# Patient Record
Sex: Female | Born: 1980
Health system: Northeastern US, Academic
[De-identification: ages and names within clinical notes are randomized; demographics above are authoritative.]

---

## 2019-09-13 ENCOUNTER — Ambulatory Visit

## 2019-09-25 ENCOUNTER — Ambulatory Visit: Admitting: Surgery

## 2019-09-25 ENCOUNTER — Ambulatory Visit: Admit: 2019-09-25 | Payer: No Typology Code available for payment source

## 2019-09-25 NOTE — Progress Notes (Signed)
 .  Progress Notes  .  Patient: Cassandra Wilson  Provider: Foster Simpson  DOB: 1980-10-18 Age: 39 Y Sex: Female  .  [facility]    Date: 09/25/2019  .  --------------------------------------------------------------------------------  .  HISTORY OF PRESENT ILLNESS  .  GENERAL:   Cassandra Wilson is a pleasant 39 year old woman who is here today  in consultation for a left lower quadrant intramuscular mass. She  noticed the mass 6 months after undergoing a cesarean section  with her fourth child. This was approximately 18 months ago. She  reports that the mass has increased in size and causes her  discomfort that is worse around the time of her menstrual cycle.  The pain is severe enough that it interferes with her daily  activities. She had a CT scan that noted a 2.0 x 2.8 x 2.4 cm of  the left rectus abdominis musculature suggesting an endometrioma  or desmoid tumor. She is here to discuss her treatment options.  .  CURRENT MEDICATIONS  .  Taking ibuprofen  Taking Tylenol  .  ALLERGIES  .  yes[Allergies Verified]  .  SURGICAL HISTORY  .  Cesarean section  .  SOCIAL HISTORY  .  .  Tobaccohistory:Currently smoking 10 cigaretts per day.  .  Alcohol Denies.  Marland Kitchen  REVIEW OF SYSTEMS  .  Surgery:  .  Constitutional:    no fever, chills, or general weakness, no  recent weight change . H&N:    no ear pain, no hoarseness, no  headache . Skin:    no skin lesions, no rash . Lymphatics:    no  swollen glands, no painful glands . Heart:    no chest pain, no  fluttering of heart . Lung:    no shortness of breath, no new or  frequent cough . Gastro:    no nausea/vomiting, no abdominal pain  or cramps, no constipation or diarrhea, no blood in stool, no  heartburn, no regurgitation . Urine:    no blood in urine, no  pain or burning while urinating . Neuro:    no dizzy spells,  fainting, no visual loss, no speech disturbance, no motor or  sensory events, no seizures . Musc:    no bone or joint pain  .  Marland Kitchen  VITAL SIGNS  .  Pain scale 0, Ht-in 64, Wt-lbs 109.6, BMI 18.81, BP 126/87, HR  121, RR 16, Temp 98.4, BSA 1.50, O2 100, Ht-cm 162.56, Wt-kg  49.71.  Marland Kitchen  PHYSICAL EXAMINATION  .  Surgery:  General Appearance  alert, oriented, and in no distress.  Gastrointestinal  abdomen soft and nontender, palpable 2.5 cm  mass at lateral aspect of cesarean section scar.  .  ASSESSMENTS  .  Left lower quadrant abdominal mass - R19.04 (Primary)  .  TREATMENT  .  Left lower quadrant abdominal mass  Notes:  Cassandra Wilson has a left lower quadrant intramuscular mass that  is been present for approximately 18 months and causes her  significant discomfort. Today her treatment options were  discussed in great detail and at this time an MRI will be ordered  for further evaluation. She will return to clinic following the  MRI to discuss the findings. She will contact our office with any  questions or concerns that arise anytime.  Thank you for allowing [place] to participate in the care of your  patient. If you have any questions or comments, please do not  hesitate to contact our office..  .  FOLLOW UP  .  prn  .  Electronically signed by Andy Gauss , MD on  11/18/2019 at 03:52 PM EDT  .  Document electronically signed by Foster Simpson   .

## 2019-09-25 NOTE — Progress Notes (Signed)
 * * *    Wilson, Cassandra **DOB:** November 02, 1980 (39 yo F) **Acc No.** [id number] **DOS:**  09/25/2019    ---       Cassandra Wilson**    ------    68 Y old Female, DOB: [birth year], External MRN: [id number]    Account Number: [id number]    [address], [id number]    Home: [phone]    Insurance: [employer]    [facility]P: [facility] Referring: Unknown Unknown    Appointment Facility: Surgical Oncology        * * *    09/25/2019 Progress Notes: Andy Gauss, MD **CHN#:** [id number]    ------    ---       **History of Present Illness**    ---     _GENERAL_ :    Cassandra Wilson is a pleasant 39 year old woman who is here today in  consultation for a left lower quadrant intramuscular mass. She noticed the  mass 6 months after undergoing a cesarean section with her fourth child. This  was approximately 18 months ago. She reports that the mass has increased in  size and causes her discomfort that is worse around the time of her menstrual  cycle. The pain is severe enough that it interferes with her daily activities.  She had a CT scan that noted a 2.0 x 2.8 x 2.4 cm of the left rectus abdominis  musculature suggesting an endometrioma or desmoid tumor. She is here to  discuss her treatment options.      **Current Medications**    ---    Taking    * ibuprofen     ---    * Tylenol     ---     **Surgical History**    ---      Cesarean section    ---     **Social History**    ---    Tobacco  history: Currently smoking 10 cigaretts per day.    Alcohol  Denies.     **Review of Systems**    ---     _Surgery_ :    Constitutional: no fever, chills, or general weakness, no recent weight  change. H&N: no ear pain, no hoarseness, no headache. Skin: no skin lesions,  no rash. Lymphatics: no swollen glands, no painful glands. Heart: no chest  pain, no fluttering of heart. Lung: no shortness of breath, no new or frequent  cough. Gastro: no nausea/vomiting, no abdominal pain or cramps,  no  constipation or diarrhea, no blood in stool, no heartburn, no regurgitation.  Urine: no blood in urine, no pain or burning while urinating. Neuro: no dizzy  spells, fainting, no visual loss, no speech disturbance, no motor or sensory  events, no seizures. Musc: no bone or joint pain.         **Vital Signs**    ---    Pain scale 0, Ht-in 64, Wt-lbs 109.6, BMI 18.81, BP 126/87, HR 121, RR 16,  Temp 98.4, BSA 1.50, O2 100, Ht-cm 162.56, Wt-kg 49.71.      **Physical Examination**    ---     _Surgery_ :    General Appearance alert, oriented, and in no distress. Gastrointestinal  abdomen soft and nontender, palpable 2.5 cm mass at lateral aspect of cesarean  section scar.         **Assessments**    ---    1\. Left lower quadrant abdominal mass - R19.04 (Primary)    ---      **  Treatment**    ---      **1\. Left lower quadrant abdominal mass**    Notes:    [name] has a left lower quadrant intramuscular mass that is been  present for approximately 18 months and causes her significant discomfort.  Today her treatment options were discussed in great detail and at this time an  MRI will be ordered for further evaluation. She will return to clinic  following the MRI to discuss the findings. She will contact our office with  any questions or concerns that arise anytime.    Thank you for allowing [place] to participate in the care of your patient. If you  have any questions or comments, please do not hesitate to contact our office..    ---     **Follow Up**    ---    prn    Electronically signed by Andy Gauss , MD on 11/18/2019 at 03:52 PM EDT    Sign off status: Completed        * * *        Surgical Oncology    [address]    [place], [address]    [place], [place] [address]    Tel: [phone]    Fax: [phone]              * * *          Progress Note: Andy Gauss, MD 09/25/2019    ---    Note generated by eClinicalWorks EMR/PM Software ([email or web address])

## 2019-10-09 ENCOUNTER — Ambulatory Visit: Admitting: Surgery

## 2019-10-09 ENCOUNTER — Ambulatory Visit: Admit: 2019-10-09 | Payer: Medicaid Other

## 2019-10-09 NOTE — Progress Notes (Signed)
 .  Progress Notes  .  Patient: Cassandra Wilson  Provider: Foster Simpson  DOB: 12-Feb-1981 Age: 39 Y Sex: Female  .  [facility]    Date: 10/09/2019  .  --------------------------------------------------------------------------------  .  REASON FOR APPOINTMENT  .  1. MRI REVIEW  .  HISTORY OF PRESENT ILLNESS  .  Ambulatory Falls and Injury Prevention:  HPI  .  Have you experienced a fall in the past year?No , Is the patient  using assistive devices such as a cane or walker?No , Do you need  assistance with ambulation while at our facility?No  .  .  GENERAL:   Cassandra Wilson is a pleasant 39 year old woman who is here today  in consultation for a left lower quadrant intramuscular mass. She  noticed the mass 6 months after undergoing a cesarean section  with her fourth child. This was approximately 18 months ago. She  reports that the mass has increased in size and causes her  discomfort that is worse around the time of her menstrual cycle.  The pain is severe enough that it interferes with her daily  activities. She had a CT scan that noted a 2.0 x 2.8 x 2.4 cm of  the left rectus abdominis musculature suggesting an endometrioma  or desmoid tumor. She was unable to get an MRI as was discussed  at her previous appointment. She is here to review her treatment  options.  .  CURRENT MEDICATIONS  .  Taking ibuprofen  Taking Tylenol  .  PAST MEDICAL HISTORY  .  Abdominal wall mass  Pregnancy: yes  .  ALLERGIES  .  yes[Allergies Verified]  .  SURGICAL HISTORY  .  Cesarean section  .  SOCIAL HISTORY  .  .  Tobaccohistory:Currently smoking 10 cigaretts per day.  .  Alcohol Occasionally.  Marland Kitchen  REVIEW OF SYSTEMS  .  Surgery:  .  Constitutional:    no fever, chills, or general weakness, no  recent weight change . H&N:    no ear pain, no hoarseness, no  headache . Skin:    no skin lesions, no rash . Lymphatics:    no  swollen glands, no painful glands . Heart:    no chest pain, no  fluttering of heart  . Lung:    no shortness of breath, no new or  frequent cough . Gastro:    no nausea/vomiting, no abdominal pain  or cramps, no constipation or diarrhea, no blood in stool, no  heartburn, no regurgitation . Urine:    no blood in urine, no  pain or burning while urinating . Neuro:    no dizzy spells,  fainting, no visual loss, no speech disturbance, no motor or  sensory events, no seizures . Musc:    no bone or joint pain .  Marland Kitchen  VITAL SIGNS  .  Pain scale 6, Ht-in 64, Wt-lbs 108.6, BMI 18.64, BP 119/75, HR  93, RR 16, BSA 1.49, O2 99, Ht-cm 162.56, Wt-kg 49.26, Wt Change  -1 lb, Temp 97.3no flu like symptoms.  .  PHYSICAL EXAMINATION  .  Surgery:  General Appearance  alert, oriented, and in no distress.  Gastrointestinal  abdomen soft and nontender, palpable 2.5 cm  mass at lateral aspect of cesarean section scar.  .  ASSESSMENTS  .  Abdominal wall mass - R22.2 (Primary)  .  TREATMENT  .  Abdominal wall mass  Notes:  Cassandra Wilson has an  abdominal wall mass that is suspected to be  an endometrioma. An MRI was recommended however she was unable to  complete the study. Today her treatment options were discussed in  great detail at this time she wishes to proceed with a radical  resection of the abdominal wall mass. The risks of surgery were  reviewed and she understands. Surgery will be scheduled to take  place in the near future and she will contact our office with any  questions or concerns that arise in the meantime.  Thank you for allowing [place] to participate in the care of your  patient. If you have any questions or comments, please do not  hesitate to contact our office.  .  FOLLOW UP  .  prn  .  Electronically signed by Andy Gauss , MD on  11/19/2019 at 05:07 PM EDT  .  Document electronically signed by Foster Simpson   .

## 2019-10-09 NOTE — Progress Notes (Signed)
 * * *    Wilson, Cassandra **DOB:** Sep 17, 1980 (39 yo F) **Acc No.** [id number] **DOS:**  10/09/2019    ---       Cassandra Wilson**    ------    59 Y old Female, DOB: [birth year], External MRN: [id number]    Account Number: [id number]    [address] [place] 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 MRI REVIEW    ---      **History of Present Illness**    ---     _Ambulatory Falls and Injury Prevention_ :    HPI Have you experienced a fall in the past year? No, Is the patient using  assistive devices such as a cane or walker? No, Do you need assistance with  ambulation while at our facility? No.    _GENERAL_ :    [name] is a pleasant 39 year old woman who is here today in  consultation for a left lower quadrant intramuscular mass. She noticed the  mass 6 months after undergoing a cesarean section with her fourth child. This  was approximately 18 months ago. She reports that the mass has increased in  size and causes her discomfort that is worse around the time of her menstrual  cycle. The pain is severe enough that it interferes with her daily activities.  She had a CT scan that noted a 2.0 x 2.8 x 2.4 cm of the left rectus abdominis  musculature suggesting an endometrioma or desmoid tumor. She was unable to get  an MRI as was discussed at her previous appointment. She is here to review her  treatment options.      **Current Medications**    ---    Taking    * ibuprofen     ---    * Tylenol     ---     **Past Medical History**    ---      Abdominal wall mass.        ---    Pregnancy: yes.        ---      **Surgical History**    ---      Cesarean section    ---     **Social History**    ---    Tobacco  history:  Currently smoking 10 cigaretts per day.    Alcohol  Occasionally.     **Review of Systems**    ---     _Surgery_ :    Constitutional: no fever, chills, or general weakness, no recent weight  change. H&N: no ear pain, no hoarseness, no headache. Skin: no skin lesions,  no rash. Lymphatics: no swollen glands, no painful glands. Heart: no chest  pain, no fluttering of heart. Lung: no shortness of breath, no new or frequent  cough. Gastro: no nausea/vomiting, no abdominal pain or cramps, no  constipation or diarrhea, no blood in stool, no heartburn, no regurgitation.  Urine: no blood in urine, no pain or burning while urinating. Neuro: no dizzy  spells, fainting, no visual loss, no speech disturbance, no motor or sensory  events, no seizures. Musc: no bone or joint pain.         **Vital Signs**    ---    Pain scale 6, Ht-in 64, Wt-lbs 108.6, BMI 18.64, BP 119/75, HR 93, RR 16, BSA  1.49, O2 99, Ht-cm 162.56, Wt-kg 49.26, Wt Change -1 lb, Temp 97.3    no flu like symptoms.      **Physical Examination**    ---     _Surgery_ :    General Appearance alert, oriented, and in no distress. Gastrointestinal  abdomen soft and nontender, palpable 2.5 cm mass at lateral aspect of cesarean  section scar.         **Assessments**    ---    1\. Abdominal wall mass - R22.2 (Primary)    ---      **Treatment**    ---      **1\. Abdominal wall mass**    Notes:    Ms. Eble has an abdominal wall mass that is suspected to be an  endometrioma. An MRI was recommended however she was unable to complete the  study. Today her treatment options were discussed in great detail at this time  she wishes to proceed with a radical resection of the abdominal wall mass. The  risks of surgery were reviewed and she understands. Surgery will be scheduled  to take place in the near future and she will contact our office with any  questions or concerns that arise in the meantime.    Thank you for allowing [place] to participate in the care of your  patient. If you  have any questions or comments, please do not hesitate to contact our office.    ---     **Follow Up**    ---    prn    Electronically signed by Andy Gauss , MD on 11/19/2019 at 05:07 PM EDT    Sign off status: Completed        * * *        Surgical Oncology    [address]    [place], [address]    [place], [place] [address]    Tel: [phone]    Fax: [phone]              * * *          Progress Note: Andy Gauss, MD 10/09/2019    ---    Note generated by eClinicalWorks EMR/PM Software ([email or web address])

## 2019-10-11 ENCOUNTER — Ambulatory Visit

## 2019-10-24 ENCOUNTER — Ambulatory Visit: Admitting: Surgery

## 2019-10-24 ENCOUNTER — Ambulatory Visit: Admit: 2019-10-24 | Payer: Medicaid Other

## 2019-10-28 ENCOUNTER — Ambulatory Visit: Admitting: Surgery

## 2019-10-28 ENCOUNTER — Ambulatory Visit: Admit: 2019-10-28 | Discharge: 2019-10-30 | Disposition: A | Discharge status: Home / Self Care | Payer: Medicaid Other

## 2019-10-28 LAB — HX MICRO-RESP VIRAL PANEL: HX COVID-19 (SARS-COV-2) RAPID: NEGATIVE

## 2019-10-28 LAB — HX BF-URINALYSIS: HX URINE PREGNANCY TEST (HCG QUAL): NEGATIVE

## 2019-10-28 NOTE — Op Note (Signed)
 Patient    Cassandra, Wilson        Med Rec #:  [id number]  Name:  Operation  10/28/2019                Pt.  Dt:                                  Location:  .  Marland Kitchen                               OPERATIVE REPORT  .  Marland Kitchen  PREOPERATIVE DIAGNOSIS:  Lower abdominal wall mass.  Marland Kitchen  POSTOPERATIVE DIAGNOSIS:  Lower abdominal wall mass.  Marland Kitchen  PROCEDURES:  1.  Radical resection of 5-cm abdominal wall mass.  2.  Mesh placement for abdominal wall reconstruction.  .  SURGEON:  Andy Gauss, M.D.  .  ASSISTANTLuan Moore, M.D.  .  ANESTHESIA:  General endotracheal.  .  ESTIMATED BLOOD LOSS:  25 mL.  Marland Kitchen  DRAINS:  One round Blake 15 placed in the preperitoneal space.  Marland Kitchen  SPECIMEN:  As above, sent to pathology.  Marland Kitchen  FINDINGS:  The patient with a mass involving the left lateral aspect of her  previous C-section scar.  It measured approximately 5 cm.  It was just  below the fascia involving the muscle and into the preperitoneal space.  It  was not attached to the bladder but we did have to push the bladder down  off of the mass itself.  .  COMPLICATIONS:  None.  .  DESCRIPTION OF PROCEDURE:  The patient was taken to the operating room and  placed on the operating room table.  The patient was then anesthetized and  intubated without difficulties.  Her abdomen was prepped and draped in the  usual sterile fashion.  A skin incision was made through her prior  C-section scar and extended slightly laterally on top of the mass.  This  was taken down through the subcutaneous tissue and we identified the  external oblique fascia.  We incised the external oblique fascia  circumferentially, taking the external oblique fascia with [place].  The mass  itself was extending through the muscle.  We took a fair amount of the  rectus muscle as well extending down.  We got to an area _____ the  preperitoneal space it was free of the tumor, and we were able to carefully  dissect this area _____ bladder.  This appears to be just above where the  external ring  would be.  We completely came circumferentially around using  electrocautery as well as clips.  The specimen, once it was removed, was  marked for orientation.  It appeared that we might be able to close the  external oblique fascia primarily.  We had undermined some of the  subcutaneous tissue as well as underneath.  We opted to go ahead and place  a piece of Parietex mesh to support the closure due to the fact ____ 8 cm x  3 cm.  We placed the mesh and secured in place using interrupted 0 PDS  sutures.  We then closed the external oblique fascia using a running 0 PDS  suture.  Prior to doing this underneath the mesh _____.  We then placed,  through a separate stab incision, a 15 Blake round drain in the  subcutaneous tissue.  This was secured in place using a 3-0 nylon suture.  The deep dermal sutures using 3-0 Vicryl sutures were then used to  reapproximate the skin.  The skin was reapproximated using a 4-0 Monocryl  in a subcuticular fashion.  The area was cleaned of using wet-to-dry.  Dermabond was placed over the wound.  The patient was then awoken and  extubated without difficulties.  The patient was taken off the operating  table, placed on a stretcher, and returned to the recovery room.  At the  end of procedure, all needle, sponge, and instrument counts were correct.  I was present for the entire case.  .  Electronically Signed  Foster Simpson, MD 11/04/2019 10:27 A  .  Marland Kitchen  Dictated by: Foster Simpson, MD  .  D:    10/30/2019  T:    10/31/2019 01:13 P  Dictation ID:  [id number]/IPP#  [id number]  .  cc:  .  Marland Kitchen      Document is preliminary until electronically or manually signed by                             attending physician.

## 2019-10-28 NOTE — Progress Notes (Signed)
 **General Surgery**    POD # 2.    **General Surgery**    POD # 2.    **Subjective**    Subjective Reports well controlled pain. Pt denies N/V with regular home diet.  Continues to pass flatus and void, but no BM yet. Pt had 2 episodes of  orthostatic hypotension yesterday, one of which was symptomatic, and received  2 boluses. Patient denies any dizziness today. Camie Patience Diet Yes.    Flatus Yes.    Bowel Movement No.    **Subjective**    Subjective Reports well controlled pain. Pt denies N/V with regular home diet.  Continues to pass flatus and void, but no BM yet. Pt had 2 episodes of  orthostatic hypotension yesterday, one of which was symptomatic, and received  2 boluses. Patient denies any dizziness today. Camie Patience Diet Yes.    Flatus Yes.    Bowel Movement No.    **Exam**    Comment Gen: NAD    Neuro: appropriate and cooperative    Pulm: No increased work of breathing    CV: RRR    Abdomen: soft non-tender, non distended; dressing clean, dry, intact; JP drain  with serosanguinous output, 30 cc overnight    Extremities: warm and well perfused, no edema    .      Vital Signs    10/30/2019 03:10              ???  Temperature: 36.7 (35-37.8Cel)          ???  Site: Oral          ???  Heart Rate: 72 (60-90)          ???  Site: Monitor          ???  BP: 115/73 (90-140/60-90)          ???  Site: Right Arm          ???  Position: Lying          ???  Method: Automated          ???  Respirations: 16 (12-20)          ???  O2 Saturation (%): 99          ???  O2 Delivery Method: Room Air          ???  MEWS Vital Sign Score: 0      10/29/2019 23:16              ???  Morse Fall Risk Total: 45      10/29/2019 13:23              ???  BP #2: 90/61 (90-140/60-90)          ???  Position: Sitting          ???  Site: Right Arm          ???  Method: Automated      CFS Vital Signs CS    10/30/2019 06:59              ???  Shift Intake Total:          ???  Shift Output Total:          ???  Shift Balance:  -          Respiratory CTA B/L.    Cardiovascular RRR; Normal S1, S2.    Gastrointestinal ABDOMEN Soft; NT; +BS.    **  Exam**    Comment Gen: NAD    Neuro: appropriate and cooperative    Pulm: No increased work of breathing    CV: RRR    Abdomen: soft non-tender, non distended; dressing clean, dry, intact; JP drain  with serosanguinous output, 30 cc overnight    Extremities: warm and well perfused, no edema    .      Vital Signs    10/30/2019 03:10              ???  Temperature: 36.7 (35-37.8Cel)          ???  Site: Oral          ???  Heart Rate: 72 (60-90)          ???  Site: Monitor          ???  BP: 115/73 (90-140/60-90)          ???  Site: Right Arm          ???  Position: Lying          ???  Method: Automated          ???  Respirations: 16 (12-20)          ???  O2 Saturation (%): 99          ???  O2 Delivery Method: Room Air          ???  MEWS Vital Sign Score: 0      10/29/2019 23:16              ???  Morse Fall Risk Total: 45      10/29/2019 13:23              ???  BP #2: 90/61 (90-140/60-90)          ???  Position: Sitting          ???  Site: Right Arm          ???  Method: Automated      CFS Vital Signs CS    10/30/2019 06:59              ???  Shift Intake Total:          ???  Shift Output Total:          ???  Shift Balance: -          Respiratory CTA B/L.    Cardiovascular RRR; Normal S1, S2.    Gastrointestinal ABDOMEN Soft; NT; +BS.    **Post-Op Assessment**    Tubes and Drains JP Drain JP Tube Details Date Placed 10/28/2019, Amount 30 ml,  Comment serosanguinous.    Dressing Dry and Intact.    **Post-Op Assessment**    Tubes and Drains JP Drain JP Tube Details Date Placed 10/28/2019, Amount 30 ml,  Comment serosanguinous.    Dressing Dry and Intact.    **Assessment and Plan**    Medication              ???   **ENOXAPARIN (LOVENOX)** 40 MG Subcutaneous QDAY for 60 Days, Clinician Dir:IF CREATININE CLEARANCE IS GREATER THAN 30 ML/MIN           ???   **DOCUSATE SODIUM** 100 MG Oral BID  for 60 Days           ???   **CHLORHEXIDINE 0.12% ORAL RINSE (PERIDEX 0.12% ORAL RINSE)** 15 ML Oral BID for 60 Days, Clinician MWU:[id number] & SPIT           ???   **  AMITRIPTYLINE (ELAVIL)** 25 MG Oral QHS for 60 Days, Clinician MBB:UYZJ 1 TABLET BY MOUTH EVERYDAY AT BEDTIME           ???   **OXYCODONE (PERCOLONE)** 5 MG Oral Q4HPRN PAIN SCORE 8-10 for 7 Days           ???   **OXYCODONE (PERCOLONE)** 2.5 MG Oral Q4HPRN PAIN SCORE 4-7 for 7 Days           ???   **ACETAMINOPHEN (TYLENOL)** 650 MG Oral Q6H First Dose Now for 60 Days           General Surgery Assessment and Plan Comment A/P: Cassandra Wilson is a previously  health 39 year old woman found to have a left lower quadrant intramuscular  mass now s/p radical resection of mass and repair with mesh, recovering as  expected with plan to today.        N: Tylenol, Toradol, dilaudid, oxycodone, amitriptyline    P/CV: Propanolol 40 mg BID; Incentive spirometry    GI: Regular diet, docusate, zofran    GU: n/a    ID: n/a    Heme: No lovenox    Endo: n/a        Kennieth Francois MS3    .    **Assessment and Plan**    Medication              ???   **ENOXAPARIN (LOVENOX)** 40 MG Subcutaneous QDAY for 60 Days, Clinician Dir:IF CREATININE CLEARANCE IS GREATER THAN 30 ML/MIN           ???   **DOCUSATE SODIUM** 100 MG Oral BID for 60 Days           ???   **CHLORHEXIDINE 0.12% ORAL RINSE (PERIDEX 0.12% ORAL RINSE)** 15 ML Oral BID for 60 Days, Clinician QDU:KRCVK & SPIT           ???   **AMITRIPTYLINE (ELAVIL)** 25 MG Oral QHS for 60 Days, Clinician FMM:CRFV 1 TABLET BY MOUTH EVERYDAY AT BEDTIME           ???   **OXYCODONE (PERCOLONE)** 5 MG Oral Q4HPRN PAIN SCORE 8-10 for 7 Days           ???   **OXYCODONE (PERCOLONE)** 2.5 MG Oral Q4HPRN PAIN SCORE 4-7 for 7 Days           ???   **ACETAMINOPHEN (TYLENOL)** 650 MG Oral Q6H First Dose Now for 60 Days           General Surgery Assessment and Plan Comment A/P: Cassandra Wilson is a previously  health 39 year old woman found to have a left  lower quadrant intramuscular  mass now s/p radical resection of mass and repair with mesh, recovering as  expected with plan to today.        N: Tylenol, Toradol, dilaudid, oxycodone, amitriptyline    P/CV: Propanolol 40 mg BID; Incentive spirometry    GI: Regular diet, docusate, zofran    GU: n/a    ID: n/a    Heme: No lovenox    Endo: n/a        Kennieth Francois MS3            --    I agree with the above note        Naseem Bou-Ayash PGY1    Surg Onc p1900.            **Electronically signed by Kennieth Francois on 10/30/2019 11:35**         **Electronically cosigned  by Arnold Long, MD on 10/30/2019 15:07**        **Electronically signed by Kennieth Francois on 10/30/2019 11:35**

## 2019-10-28 NOTE — Progress Notes (Signed)
 **Note**    _POST OP CHECK_        S/P radical resection of abdominal wall mass and abdominal wall repair w/ mesh        S: Patient reports well-controlled pain on the current regimen. Has voided  300+ ccs since the operation. Not yet passing flatus or having bowel  movements. Tolerating regular diet without nausea of vomiting. Otherwise  asymptomatic.        O: VitalsT: 37.1  HR: 70-94 BP: 106-135/64-87 O2: 96-94% on RA JP: 50 cc  serosanguinous output @ 4 pm    Gen: NAD    Neuro: appropriate and cooperative    Pulm: CTAB    CV: RRR    Abdomen: soft non tender, non distended; dressing clean, dry, intact    Extremities: warm and well perfused, no edema, SCDs in place        A/P: [name] is a previously health 39 year old woman found to have a  left lower quadrant intramuscular mass now s/p radical resection of mass and  repair with mesh, recovering appropriately now transferred to the floor.        N: Tylenol, Toradol, dilaudid, oxycodone, amitriptyline    P/CV: Propanolol 40 mg BID    GI: Regular diet    GU: D/c fluids    ID: n/a    Heme: Lovenox 40 mg sub-q    Endo: n/a        Hania Mumtaz, MS4 [phone]        **Note**    _POST OP CHECK_        S/P radical resection of abdominal wall mass and abdominal wall repair w/ mesh        S: Patient reports well-controlled pain on the current regimen. Has voided  300+ ccs since the operation. Not yet passing flatus or having bowel  movements. Tolerating regular diet without nausea of vomiting. Otherwise  asymptomatic.        O: VitalsT: 37.1  HR: 70-94 BP: 106-135/64-87 O2: 96-94% on RA JP: 50 cc  serosanguinous output @ 4 pm    Gen: NAD    Neuro: appropriate and cooperative    Pulm: CTAB    CV: RRR    Abdomen: soft non tender, non distended; dressing clean, dry, intact    Extremities: warm and well perfused, no edema, SCDs in place        A/P: [name] is a previously health 39 year old woman found to have a  left lower quadrant intramuscular mass now s/p radical  resection of mass and  repair with mesh, recovering appropriately now transferred to the floor.        N: Tylenol, Toradol, dilaudid, oxycodone, amitriptyline    P/CV: Propanolol 40 mg BID    GI: Regular diet, docusate, zofran    GU: D/c fluids    ID: n/a    Heme: Lovenox 40 mg sub-q starting tmrw    Endo: n/a        Sherran Needs, MS4 [phone]        --    I agree with the medical student evaluation and have edited their documents as  appropriate to accurately reflect my clinical judgement on the patient's  current condition.        Naseem Bou-Ayash PGY1    Surg Onc p1900        **Note**    _POST OP CHECK_        S/P radical resection of abdominal wall mass and abdominal  wall repair w/ mesh        S: Patient reports well-controlled pain on the current regimen. Has voided  300+ ccs since the operation. Not yet passing flatus or having bowel  movements. Tolerating regular diet without nausea of vomiting. Otherwise  asymptomatic.        O: VitalsT: 37.1  HR: 70-94 BP: 106-135/64-87 O2: 96-94% on RA JP: 50 cc  serosanguinous output @ 4 pm    Gen: NAD    Neuro: appropriate and cooperative    Pulm: CTAB    CV: RRR    Abdomen: soft non tender, non distended; dressing clean, dry, intact    Extremities: warm and well perfused, no edema, SCDs in place        A/P: [name] is a previously health 39 year old woman found to have a  left lower quadrant intramuscular mass now s/p radical resection of mass and  repair with mesh, recovering appropriately now transferred to the floor.        N: Tylenol, Toradol, dilaudid, oxycodone, amitriptyline    P/CV: Propanolol 40 mg BID    GI: Regular diet, docusate, zofran    GU: D/c fluids    ID: n/a    Heme: Lovenox 40 mg sub-q starting tmrw    Endo: n/a        Sherran Needs, MS4 [phone]        --    I agree with the medical student evaluation and have edited their documents as  appropriate to accurately reflect my clinical judgement on the patient's  current condition.        Arnold Long PGY1    Surg Onc [phone]                **Electronically signed by Sherran Needs on 10/28/2019 17:42**         **Electronically cosigned by Arnold Long, MD on 10/28/2019 18:32**        **Electronically signed by Sherran Needs on 10/28/2019 17:42**         **Electronically cosigned by Arnold Long, MD on 10/28/2019 18:32**        **Electronically signed by Sherran Needs on 10/28/2019 17:42**
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
## 2019-10-29 LAB — HX CHEM-OTHER
HX CALCIUM (CA): 8.6 mg/dL (ref 8.5–10.5)
HX MAGNESIUM: 1.9 mg/dL (ref 1.6–2.6)
HX PHOSPHORUS: 3.3 mg/dL (ref 2.7–4.5)

## 2019-10-29 LAB — HX HEM-ROUTINE
HX BASO #: 0 10*3/uL (ref 0.0–0.2)
HX BASO: 1 %
HX EOSIN #: 0 10*3/uL (ref 0.0–0.5)
HX EOSIN: 0 %
HX HCT: 34 % (ref 32.0–45.0)
HX HGB: 11.3 g/dL (ref 11.0–15.0)
HX IMMATURE GRANULOCYTE#: 0 10*3/uL (ref 0.0–0.1)
HX IMMATURE GRANULOCYTE: 0 %
HX LYMPH #: 2.4 10*3/uL (ref 1.0–4.0)
HX LYMPH: 28 %
HX MCH: 31.4 pg (ref 26.0–34.0)
HX MCHC: 33.2 g/dL (ref 32.0–36.0)
HX MCV: 94.4 fL (ref 80.0–98.0)
HX MONO #: 0.9 10*3/uL — ABNORMAL HIGH (ref 0.2–0.8)
HX MONO: 10 %
HX MPV: 10.4 fL (ref 9.1–11.7)
HX NEUT #: 5.4 10*3/uL (ref 1.5–7.5)
HX NRBC #: 0 10*3/uL
HX NUCLEATED RBC: 0 %
HX PLT: 251 10*3/uL (ref 150–400)
HX RBC BLOOD COUNT: 3.6 M/uL — ABNORMAL LOW (ref 3.70–5.00)
HX RDW: 13.7 % (ref 11.5–14.5)
HX SEG NEUT: 62 %
HX WBC: 8.8 10*3/uL (ref 4.0–11.0)

## 2019-10-29 LAB — HX CHEM-PANELS
HX ANION GAP: 6 (ref 3–14)
HX BLOOD UREA NITROGEN: 9 mg/dL (ref 6–24)
HX CHLORIDE (CL): 105 meq/L (ref 98–110)
HX CO2: 26 meq/L (ref 20–30)
HX CREATININE (CR): 0.68 mg/dL (ref 0.57–1.30)
HX GFR, AFRICAN AMERICAN: 129 mL/min/{1.73_m2}
HX GFR, NON-AFRICAN AMERICAN: 111 mL/min/{1.73_m2}
HX GLUCOSE: 101 mg/dL (ref 70–139)
HX POTASSIUM (K): 4.2 meq/L (ref 3.6–5.1)
HX SODIUM (NA): 137 meq/L (ref 135–145)

## 2019-10-29 LAB — HX DIABETES: HX GLUCOSE: 101 mg/dL (ref 70–139)

## 2019-10-29 MED FILL — oxyCODONE HCL 5 MG TABS: 2 days supply | Qty: 14 | Fill #0 | Status: AC

## 2019-10-30 LAB — HX HEM-ROUTINE
HX HCT: 33.5 % (ref 32.0–45.0)
HX HGB: 11.1 g/dL (ref 11.0–15.0)
HX MCH: 31.7 pg (ref 26.0–34.0)
HX MCHC: 33.1 g/dL (ref 32.0–36.0)
HX MCV: 95.7 fL (ref 80.0–98.0)
HX MPV: 9.9 fL (ref 9.1–11.7)
HX NRBC #: 0 10*3/uL
HX NUCLEATED RBC: 0 %
HX PLT: 237 10*3/uL (ref 150–400)
HX RBC BLOOD COUNT: 3.5 M/uL — ABNORMAL LOW (ref 3.70–5.00)
HX RDW: 13.8 % (ref 11.5–14.5)
HX WBC: 5.9 10*3/uL (ref 4.0–11.0)

## 2019-10-30 LAB — HX DIABETES: HX GLUCOSE: 84 mg/dL (ref 70–139)

## 2019-10-30 LAB — HX CHEM-OTHER
HX CALCIUM (CA): 8.5 mg/dL (ref 8.5–10.5)
HX MAGNESIUM: 1.9 mg/dL (ref 1.6–2.6)
HX PHOSPHORUS: 3.5 mg/dL (ref 2.7–4.5)

## 2019-10-30 LAB — HX CHEM-PANELS
HX ANION GAP: 7 (ref 3–14)
HX BLOOD UREA NITROGEN: 7 mg/dL (ref 6–24)
HX CHLORIDE (CL): 106 meq/L (ref 98–110)
HX CO2: 24 meq/L (ref 20–30)
HX CREATININE (CR): 0.64 mg/dL (ref 0.57–1.30)
HX GFR, AFRICAN AMERICAN: 132 mL/min/{1.73_m2}
HX GFR, NON-AFRICAN AMERICAN: 114 mL/min/{1.73_m2}
HX GLUCOSE: 84 mg/dL (ref 70–139)
HX POTASSIUM (K): 4.2 meq/L (ref 3.6–5.1)
HX SODIUM (NA): 137 meq/L (ref 135–145)

## 2019-10-30 MED FILL — oxyCODONE HCL 5 MG TABS: 2 days supply | Qty: 14 | Fill #0 | Status: AC

## 2019-10-30 MED FILL — ENOXAPARIN 40 MG/0.4 ML SYR: 5 days supply | Qty: 2 | Fill #0 | Status: AC

## 2019-10-30 MED FILL — IBUPROFEN 200 MG TAB: 2 days supply | Qty: 14 | Fill #0 | Status: AC

## 2019-10-30 MED FILL — *ACETAMINOPHEN 500mg Tablet: 3 days supply | Qty: 20 | Fill #0 | Status: AC

## 2019-10-31 LAB — HX COLONOSCOPY

## 2019-10-31 LAB — HX SURGICAL

## 2019-11-06 LAB — HX PATHOLOGY

## 2019-11-10 ENCOUNTER — Ambulatory Visit: Admitting: Emergency Medicine

## 2019-11-10 ENCOUNTER — Emergency Department
Admit: 2019-11-10 | Discharge: 2019-11-10 | Disposition: A | Discharge status: Home / Self Care | Payer: No Typology Code available for payment source

## 2019-11-10 NOTE — ED Provider Notes (Signed)
 .  .  Name: Cassandra Wilson, Cassandra Wilson  MRN: [id number]  Age: 39 yrs  Sex: Female  DOB: 02/03/1981  Arrival Date: 11/10/2019  Arrival Time: 11:10  Account#: [id number]  Bed A10  [facility]  Chief Complaint: Post Surgical Pain  .  Presentation:  09/05  11:24 Presenting complaint: Patient states: Post surgical pain: Pt    km31        reports having abd surgery (pt is unsure of what type of        surgery) on 10/28/19. Pt reports ran out of pain medication 3        days ago and is having worsening pain at drain site. Pt states        attempted to reach surgery who did not return her calls. Pt        denies fevers. No obvious swelling or redness at incision site.        Dressing appears clean and dry with a scant amount of blood in        drainage bag.  11:24 Method Of Arrival: Walk In                                      km31  11:24 Acuity: Adult 3                                                 km31  .  Historical:  - Allergies:  11:26 No known Allergies;                                             km31  - Home Meds:  11:26 Patient Denies [Active];                                        km31  - PMHx:  11:26 None;                                                           km31  - PSHx:  11:26 abd surgury 10/28/19;                                            km31  .  - Social history: Smoking status: The patient is not a current    smoker.  .  .  Screening:  11:26 Fall Risk None identified. Exposure Risk/Travel Screening:      km31        COVID Symptomsquestion None. Known COVID 19 exposurequestion No. DPH requests        Isolationquestion(COVID) No. Have you tested + for COVIDquestion No. COVID 19        Vaccinequestion Yes-patient states they completed COVID vaccine  recommendations over 2 weeks ago.  .  Vital Signs:  11:20 BP 115 / 76 Left Arm Sitting (auto/reg); Pulse 95 Monitor; Resp km50        16 Spontaneous; Temp 36.6(O); Pulse Ox 100% on R/A;  13:53 BP 113 / 75; Pulse 74; Resp 19; Temp 37.1; Pulse Ox  95% on R/A; jh40  14:26 BP 118 / 77; Pulse 88; Resp 20; Temp 37.1; Pulse Ox 100% on R/A;ji2  .  Glasgow Coma Score:  11:26 Eye Response: spontaneous(4). Verbal Response: oriented(5).     km31  .  Name:Cassandra Wilson, Cassandra Wilson  [id number]  [id number]  Page 1 of 3  %%PAGE  .  Name: Cassandra Wilson, Cassandra Wilson  MRN: [id number]  Age: 61 yrs  Sex: Female  DOB: May 05, 1980  Arrival Date: 11/10/2019  Arrival Time: 11:10  Account#: [id number]  Bed A10  [facility]  Chief Complaint: Post Surgical Pain  .        Motor Response: obeys commands(6). Total: 15.  12:02 Eye Response: spontaneous(4). Verbal Response: oriented(5).     jh40        Motor Response: obeys commands(6). Total: 15.  .  Triage Assessment:  11:26 General: Appears in no apparent distress, well nourished, well  km31        groomed, Behavior is appropriate for age, cooperative. Pain:        Complains of pain in abdomen Pain currently is 8/10. Neuro: No        deficits noted. Cardiovascular: No deficits noted. Respiratory:        Airway is patent Respiratory effort is even, unlabored,        Respiratory pattern is regular. GI: Denies diarrhea, nausea,        vomiting. Skin: Skin is normal.  .  Assessment:  12:02 Reassessment: 39 year old female presenting with abd pain after jh40        surgery 8/23. Pt states her "c-section scar was out so they        removed it". Pt states she gets the result of what the mass was        on Friday. Pt has a JP drain that she states has not had any        drainage since Friday. Pt ran out of the pain medication 3 days        ago and has only been taking ibuprofen. Pt states the pain is        around the drain site. Pt denies fever, chills, difficulty        moving bowels, difficulty urinating. General: Appears slender,        well nourished, well groomed, Behavior is appropriate for age,        cooperative. Pain: Complains of pain in left lower quadrant.        Neuro: Eye opening: Spontaneously Level on consciousness:         Sustained Attention Verbal Response: Orientation: Oriented x 3        Speech: Clear. Cardiovascular: Capillary refill < 3 seconds.        Respiratory: Airway is patent Respiratory effort is even,        unlabored, Respiratory pattern is regular, symmetrical. GI:        Abdomen is flat, Abd is soft X 4 quads Abd is tender to        palpation Denies constipation, diarrhea, nausea. GU: Denies        burning with urination, incontinence,  urinary frequency. Skin:        Skin Steri strips over surgical site on the lower abdomen. JP        drain sutured into left abdomen. Skin is dry, Skin is normal.        Musculoskeletal: Circulation, motion, and sensation intact        Range of motion intact in all extremities.  13:53 Reassessment: Surgery in with pt to take out JP drain.          jh40  .  Observations:  11:10 Patient arrived in ED.                                          mm80  11:17 Patient Visited By: Criselda Peaches                             sk29  11:17 Registration completed.                                         sk29  .  Name:Cassandra Wilson, Cassandra Wilson  [id number]  [id number]  Page 2 of 3  %%PAGE  .  Name: Cassandra Wilson, Cassandra Wilson  MRN: [id number]  Age: 77 yrs  Sex: Female  DOB: 02/05/1981  Arrival Date: 11/10/2019  Arrival Time: 11:10  Account#: [id number]  Bed A10  [facility]  Chief Complaint: Post Surgical Pain  .  11:26 Triage Completed.                                               km31  11:38 Patient Visited By: Derrek Gu  .  Interventions:  11:11 Digital Picture Scanned into Chart                              mm80  11:32 Demo Sheet Scanned into Chart                                   dk23  .  Outcome:  14:10 Discharge ordered by MD.                                        BC48  14:26 Discharged to home ambulatory. Condition: stable. Pain: Denies  ji2        pain. Discharge instructions given to patient, Instructed on:        discharge instructions,  follow up and referral plans.        Demonstrated understanding of instructions. Discharge        Assessment: Patient awake, alert and oriented x 3. No cognitive        and/or functional deficits noted. Patient verbalized        understanding of disposition instructions. Chart  Status Nursing        note complete and electronically signed.  14:27 Patient left the ED.                                            ji2  .  Signatures:  Nelwyn Salisbury                        RN   km31  Hassan Buckler                        RN   ji2  Katheran Awe, Shonice                              sk29  [place], Reuel Boom                         MD   dc23  Thornton Dales                       CCT  jh40  Adele Dan                       CCT  dk23  [place], [place]                        Reg  mm80  Leta Baptist                        CCT  km50  .  .  .  .  .  .  .  .  .  .  .  .  .  .  .  Name:Cassandra Wilson, Cassandra Wilson  [id number]  [phone]  Page 3 of 3  .  %%END

## 2019-11-10 NOTE — H&P (Signed)
**  Chief Complaint / HPI**    Chief Complaint pain near JP site.    HPI Cassandra Wilson is a previously healthy 81 F with history of a L sided  abdominal wall mass now s/p removal of mass with mesh placement (8/23) who  presents with pain near JP. Surgery consulted for drain removal.        Patient states she has been fine since discharge on 8/25. She ran out of her  oxycodone 3 days ago and has had pain around the JP site since then. States  since the surgery she has had minimal output from the JP, about 5 cc of  serosang output, and has had zero output since yesterday. Denies  fevers/chills. Of note surgical pathology consistent with endometrial tissue.        PMH: none    PSH: none    Meds: see [facility] med rec    NKDA.    Tolerating Diet Yes.    Flatus Yes.    Bowel Movement Yes.    **Allergies**              -  NKDA        **ROS**    Systems Review Head Denies, Mouth Denies, Throat Denies, Neck Denies,  Cardiovascular Denies, Respiratory Denies, Gastrointestinal pain near JP site  in LLQ, Peripheral Vascular Denies, Neurological Denies, Endocrine Denies.    **Exam**    Comment Afebrile, VSS    Gen: well appearing, NAD    HEENT: NC AT    CV: RRR    Pulm: on RA, no increased WOB    Abd: soft, midly TTP in LLQ, ND. incision well approximated with steris, no  surrounding erythema. JP with minimal serous output    Ext: WWP .    **Assessment and Plan**    General Surgery Assessment and Plan Comment 44 F with history of a L sided  abdominal wall mass now s/p removal of mass with mesh placement (8/23) who  presents with pain near JP. Surgery consulted for drain removal.        On exam, patient is well appearing and in no acute distress. She is afebrile  and vitals are normal and stable. No labs obtained. Her abdominal exam is  benign - she is really just tender near the JP exit site. The JP has minimal  serous output and has had minimal output over the past few days. The JP was  removed at bedside without issue. The  patient can be discharged home from the  ED and has follow up scheduled with Dr. Derrill Kay on 9/10.        Patient seen with chief Cassandra Wilson PGY4    Cassandra Wilson [id number].            **Electronically signed by Cassandra Malta, MD on 11/10/2019 15:46**

## 2019-11-10 NOTE — ED Provider Notes (Signed)
 .  .  Name: Cassandra Wilson, Cassandra Wilson  MRN: [id number]  Age: 39 yrs  Sex: Female  DOB: 1980/07/20  Arrival Date: 11/10/2019  Arrival Time: 11:10  Account#: [id number]  .  Working Diagnosis: Abdominal pain, unspecified  [facility] [place]  .  HPI:  09/05  12:20 This 39 yrs old White Female presents to ER via Walk In with    dc23        complaints of Post Surgical Pain.  37:16 39 year old with recent LLQ intramuscular mass s/p resection of dc23        abdominal wall mass and abdominal wall repair w/ mesh on 8/23        who presents with worsening LLQ pain. She reports that she was        discharged on 8/25 with a drain in place and was taking        oxycodone at home. She denies having any pain while at home.        She ran out of oxycodone on Thursday. She states that the drain        stopped draining a lot starting Friday. She had worsening pain        around the drain site starting last night and she was not able        to sleep. She was unable to contact the surgery team so came to        the ED for evaluation. No fevers or chills. The path from        surgery showed endometriosis. She just completed her period on        8/26. No other complaints.  .  Historical:  - Allergies: No known Allergies;  - Home Meds: Patient Denies;  - PMHx: None;  - PSHx: abd surgury 10/28/19;  - Social history: Smoking status: The patient is not a current    smoker.  .  .  ROS:  12:20 Constitutional: Negative for chills, fever.                     dc23  12:20 Cardiovascular: Negative for chest pain.  12:20 Respiratory: Negative for shortness of breath.  12:20 Abdomen/GI: Positive for abdominal pain, Negative for nausea,        vomiting, diarrhea, constipation.  .  Vital Signs:  11:20 BP 115 / 76 Left Arm Sitting (auto/reg); Pulse 95 Monitor; Resp km50        16 Spontaneous; Temp 36.6(O); Pulse Ox 100% on R/A;  13:53 BP 113 / 75; Pulse 74; Resp 19; Temp 37.1; Pulse Ox 95% on R/A; jh40  14:26 BP 118 / 77; Pulse 88; Resp 20; Temp 37.1;  Pulse Ox 100% on R/A;ji2  .  Glasgow Coma Score:  11:26 Eye Response: spontaneous(4). Verbal Response: oriented(5).     km31        Motor Response: obeys commands(6). Total: 15.  .  Name:Cassandra Wilson, Cassandra Wilson  [id number]  [id number]  Page 1 of 4  %%PAGE  .  Name: Cassandra Wilson, Cassandra Wilson  MRN: [id number]  Age: 35 yrs  Sex: Female  DOB: [birth year]  Arrival Date: 11/10/2019  Arrival Time: 11:10  Account#: [id number]  .  Working Diagnosis: Abdominal pain, unspecified  [facility] [place]  .  12:02 Eye Response: spontaneous(4). Verbal Response: oriented(5).     jh40        Motor Response: obeys commands(6). Total: 15.  .  Exam:  12:20 Constitutional: The patient  appears alert, awake, comfortable.  dc23  12:20 Eyes: Extraocular movements: intact throughout.  12:20 Respiratory: the patient does not display signs of respiratory        distress,  Respirations: normal.  12:20 Cardiovascular: Rate: tachycardic, Rhythm: regular.  12:20 Abdomen/GI: Palpation: abdomen is soft and non-tender, drain        present in LLQ with a small amount of serosanguineous drainage        in the tubing only, drain site otherwise without surrounding        redness, mild tenderness to palpation surrounding the site,        surgical incision covered with steri-strips is c/d/i .  Marland Kitchen  MDM:  14:08 Differential diagnosis: non-specific abd pain,                  dc23        Ureterolithiasis, pain from drain, endometriosis.  14:16 ED course: patient presents with abdominal pain around her      dc23        drain site. ED course: surgery came to evaluate patient and        pulled her drain. Patient was feeling well after and discharged        home with her prior surgery follow up as scheduled. Counseling:        I had a detailed discussion with the patient and/or guardian        regarding: the historical points, exam findings, and any        diagnostic results supporting the discharge diagnosis, need for        followup, to return to the emergency  department if symptoms        worsen or persist or if there are any questions or concerns        that arise at home, Specialist follow up. A consult was        requested from: Surgery. Resident chart complete and        electronically signed: Jamesetta So, MD.  .  Attending Notes:  12:38 Attestation: Assessment and care plan reviewed with             kl26        resident/midlevel provider. See their note for details.        Resident's history reviewed, patient interviewed and examined.        Attending HPI: HPI: 39 yo F presents with post surgical pain.        On 8/23 she had abdominal wall mass that path showed        endometriosis. Last night started have pain around the drain        site. Nothing making it better or worse. Is still draining        serosanginous fluid. Pain constant in nature. Attending ROS        Constitutional: Review of systems is significant for:        Constitutional: No fever. Skin: No rashes. HEENT: No vision  .  Name:Cassandra Wilson, Cassandra Wilson  [id number]  [id number]  Page 2 of 4  %%PAGE  .  Name: Cassandra Wilson, Cassandra Wilson  MRN: [id number]  Age: 44 yrs  Sex: Female  DOB: 1980/04/28  Arrival Date: 11/10/2019  Arrival Time: 11:10  Account#: [id number]  .  Working Diagnosis: Abdominal pain, unspecified  [facility] [place]  .        problems. Resp: No SOB. CV: No chest pain. GI: phpi. GU: No  dysuria. Back: No back pain. MSK: No new gait abnormality        Neuro: No headache. All other ROS elements reviewed in HPI.        Attending Exam: Constitutional: Exam General: Alert, no acute        distress. Head: Atraumatic. Eyes: Marland Kitchen Normal conjunctiva. ENT:        Moist mucous membranes. Skin: No jaundice. Lungs: Non-labored        respirations. Pulse ox reviewed with no evidence of hypoxia.        Heart: Regular rate and rhythm.Abd: Soft; non-distended;        non-tender to deep palpation, incision site c/d/i, jp drain        mild serosangionous output, mild pain around jp site MSK: No         peripheral edema. Neuro: Alert, oriented to themselves,        location; fluent, comprehensive speech; steady, balanced gait.        Psych: Cooperative with exam; appropriate mood and affect. I        have reviewed Nurses Notes, Old Records in: [facility].  .  Disposition Summary:  11/10/19 14:10  Discharge Ordered        Location: Home -                                                dc23        Problem: new                                                    dc23        Symptoms: have improved                                         dc23        Condition: Stable                                               dc23        Diagnosis          - Abdominal pain, unspecified                                 dc23        Followup:                                                       dc23          - With: Private Physician          - When:          - Reason: Continuance of care        Followup:  dc23          - With: Emergency Department          - When: As needed          - Reason: If symptoms return or worsen        Discharge Instructions:          - Discharge Summary Sheet                                     dc23        Forms:          - Medication Reconciliation Form                              dc23          - Fax Summary                                                 dc23  Signatures:  Dispatcher, [employer]                          dispa  Nelwyn Salisbury                        RN   km31  [place], Shonice                              sk29  [place], Reuel Boom                         MD   [phone]  .  Name:Cassandra Wilson, Cassandra Wilson  [id number]  [phone]  Page 3 of 4  %%PAGE  .  Name: Cassandra Wilson, Cassandra Wilson  MRN: [id number]  Age: 78 yrs  Sex: Female  DOB: [birth year]  Arrival Date: 11/10/2019  Arrival Time: 11:10  Account#: [id number]  .  Working Diagnosis: Abdominal pain, unspecified  [facility] [place]  .  Edmund Hilda                          MD    kl26  .  Corrections: (The following items were deleted from the chart)  14:27 13:36 CBC/Diff (With Plt)+Hematology ordered. dispat            dispa  t  .  Document is preliminary until electronically or manually signed by the atte  nding physician  .  .  .  .  .  .  .  .  .  .  .  .  .  .  .  .  .  .  .  .  .  .  .  .  .  .  .  .  .  .  .  .  .  .  .  .  .  Name:Cassandra Wilson, Cassandra Wilson  [id number]  [id number]  Page 4 of 4  .  %%END

## 2019-11-15 ENCOUNTER — Ambulatory Visit: Admitting: Surgery

## 2019-11-15 ENCOUNTER — Ambulatory Visit: Admit: 2019-11-15 | Payer: No Typology Code available for payment source

## 2019-11-15 NOTE — Progress Notes (Signed)
.    Progress Notes  .  Patient: Cassandra Wilson  Provider: Foster Simpson  DOB: 12/04/1980 Age: 39 Y Sex: Female  .  [facility]    Date: 11/15/2019  .  --------------------------------------------------------------------------------  .  REASON FOR APPOINTMENT  .  1. 2 WK POST-OP RAD RESECTION ABD WALL MASS  .  HISTORY OF PRESENT ILLNESS  .  GENERAL:   Ms. Tatro returns after resection of an abdominal wall mass  near her C-section scar. She is doing well at this time with no  complaints.  .  CURRENT MEDICATIONS  .  Taking ibuprofen  Taking Tylenol  .  PAST MEDICAL HISTORY  .  Abdominal wall mass  .  ALLERGIES  .  N.K.D.A.  .  SURGICAL HISTORY  .  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  SOCIAL HISTORY  .  .  Tobaccohistory:Currently smoking 10 cigaretts per day.  .  Alcohol Occasionally.  Marland Kitchen  VITAL SIGNS  .  Pain scale 0, Ht-in 64, Wt-lbs 107.4, BMI 18.43, BP 104/74, HR  95, RR 16, Temp 98.4, BSA 1.48, O2 100, Ht-cm 162.56, Wt-kg  48.72, Wt Change -1.2 lbpatient denies having flu like symptoms.  .  PHYSICAL EXAMINATION  .  Wound is clean and dry.  .  ASSESSMENTS  .  Abdominal wall mass - R22.2 (Primary)  .  Ms. Echeverry is doing well. We did discuss the pathology being  an endometrioma. She will follow up prn.  .  FOLLOW UP  .  prn  .  Electronically signed by Andy Gauss , MD on  11/29/2019 at 10:08 AM EDT  .  Document electronically signed by Foster Simpson   .

## 2019-11-15 NOTE — Progress Notes (Signed)
 * * *    Cassandra Wilson, Cassandra Wilson **DOB:** [birth year] (39 yo F) **Acc No.** [id number] **DOS:**  11/15/2019    ---       Cassandra Wilson**    ------    6 Y old Female, DOB: 1980-08-27, External MRN: [id number]    Account Number: [id number]    [address], [id number]    Home: [phone]    Insurance: [employer]    PCP: [facility] Referring: Unknown Unknown    Appointment Facility: Surgical Oncology        * * *    11/15/2019 Progress Notes: Andy Gauss, MD **CHN#:** [id number]    ------    ---       **Reason for Appointment**    ---      1\. 2 WK POST-OP RAD RESECTION ABD WALL MASS    ---      **History of Present Illness**    ---     _GENERAL_ :    Cassandra Wilson returns after resection of an abdominal wall mass near her  C-section scar. She is doing well at this time with no complaints.      **Current Medications**    ---    Taking    * ibuprofen     ---    * Tylenol     ---     **Past Medical History**    ---      Abdominal wall mass.        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    Alcohol  Occasionally.     **Allergies**    ---      N.K.D.A.    ---      **Vital Signs**    ---    Pain scale **0** , Ht-in 64, Wt-lbs **107.4** , BMI **18.43** , BP **104/74**  , HR **95** , RR **16** , Temp **98.4** , BSA **1.48** , O2 **100** , Ht-cm  162.56, Wt-kg **48.72** , Wt Change -1.2 lb    patient denies having flu like symptoms.      **Physical Examination**    ---    Wound is clean and dry.      **Assessments**    ---    1\. Abdominal wall mass - R22.2 (Primary)    ---     Cassandra Wilson is doing well. We did discuss the pathology being an  endometrioma. She will follow up prn.    ---      **Follow Up**    ---    prn    Electronically signed by Andy Gauss , MD on 11/29/2019 at 10:08 AM  EDT    Sign off status: Completed        * * *        Surgical Oncology    [address]    [place], [address]    [place], [place] [address]    Tel: [phone]    Fax: [phone]              * * *          Progress Note: Andy Gauss, MD 11/15/2019    ---  Note generated by e[employer]Works EMR/PM Software ([email or web address])

## 2019-11-29 ENCOUNTER — Ambulatory Visit (HOSPITAL_BASED_OUTPATIENT_CLINIC_OR_DEPARTMENT_OTHER): Admitting: Psychiatry

## 2023-08-31 IMAGING — MR Angios Cranio ARTERIAL
16 of 20 series · 38 of 48 positions shown · non-contrast
Comparison: none

[Series 8001: sagital_t1_(person_name)_(person_name) · sagittal · 5.0mm · 0.43mm/px · 1 of 25 slices shown]
[im 1/25]
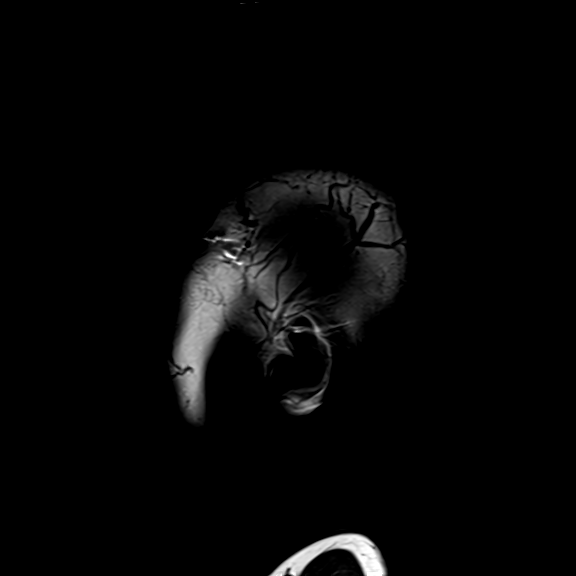

[Series 9001: angio_tof_(person_name)3(person_name)_arterial · axial · 0.5mm · 0.43mm/px · z∈[-128,-30]mm · 7 of 208 slices shown]
[im 1/208]
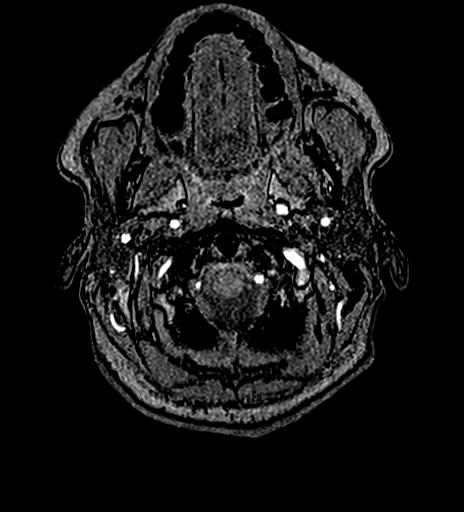
[im 35/208]
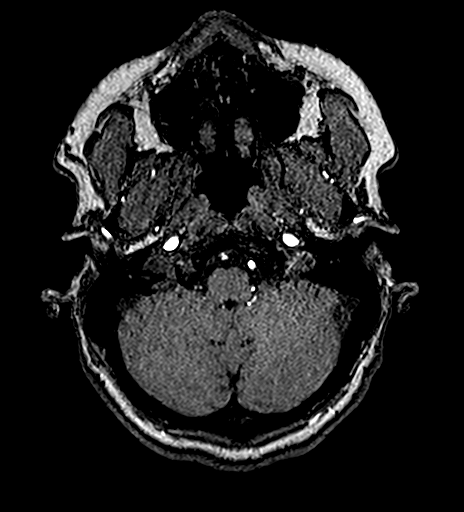
[im 70/208]
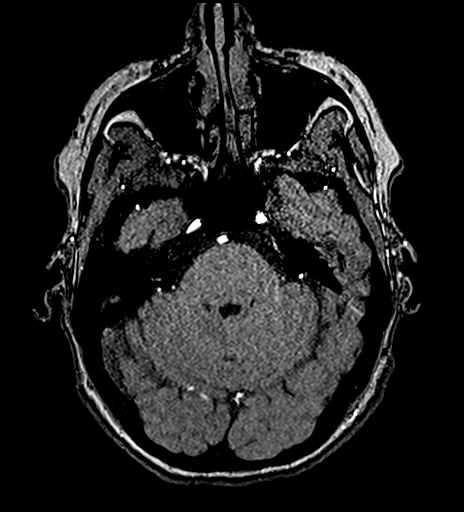
[im 104/208]
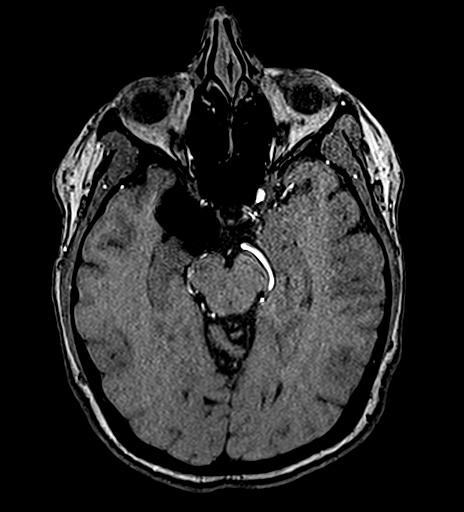
[im 139/208]
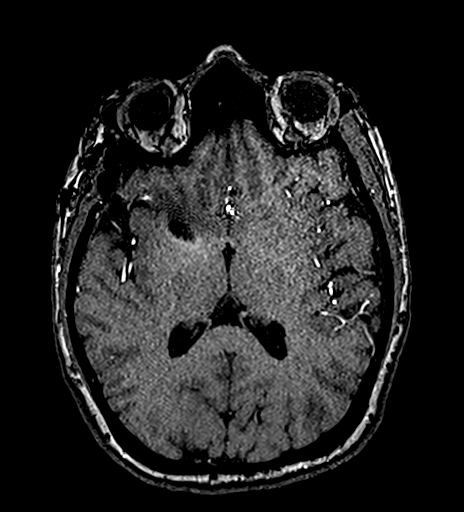
[im 173/208]
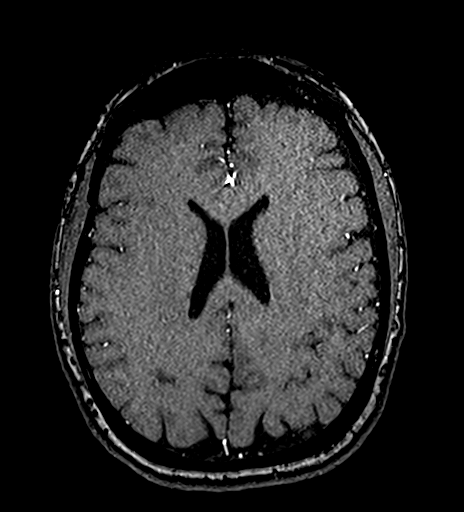
[im 208/208]
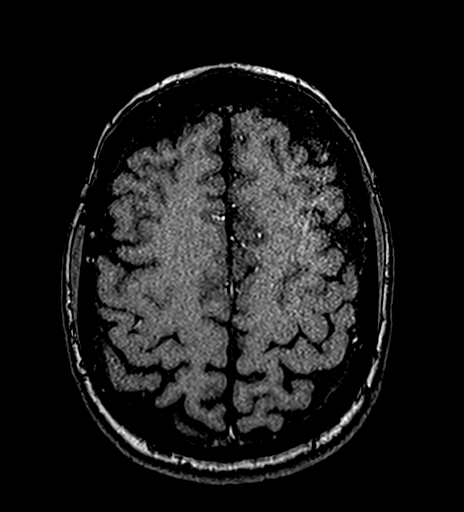

[axial_flair_(person_name) · axial · 5.5mm · 0.42mm/px · 1 of 25 slices shown]
[im 1/25]
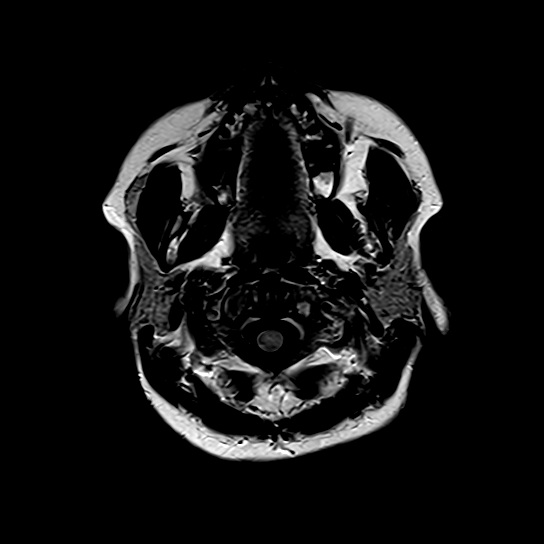

[resolve_(id)_trace_tra_p2_160_tracew · axial · 5.0mm · 1.44mm/px · 1 of 25 slices shown (1 of 2)]
[im 1/25]
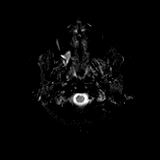

[resolve_(id)_trace_tra_p2_160_tracew · axial · 5.0mm · 1.44mm/px · 1 of 25 slices shown (2 of 2)]
[im 1/25]
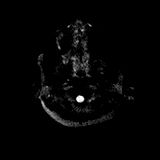

[resolve_(id)_trace_tra_p2_160_adc · axial · 5.0mm · 1.44mm/px · 1 of 25 slices shown]
[im 1/25]
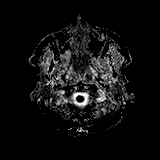

[axial swi_mag · axial · 3.0mm · 1.02mm/px · z∈[-121,+42]mm · 2 of 56 slices shown]
[im 1/56]
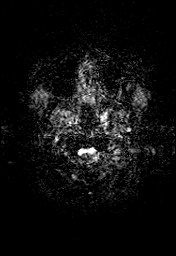
[im 56/56]
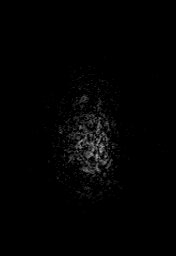

[axial swi_pha · axial · 3.0mm · 1.02mm/px · z∈[-121,+39]mm · 2 of 55 slices shown]
[im 1/55]
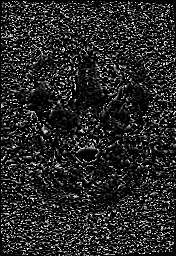
[im 55/55]
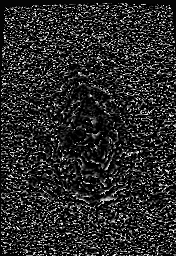

[axial swi_swi · axial · 3.0mm · 1.02mm/px · z∈[-121,+42]mm · 2 of 56 slices shown]
[im 1/56]
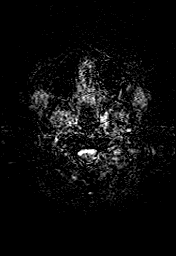
[im 56/56]
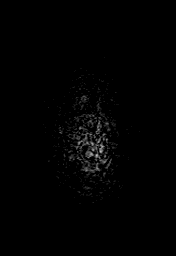

[axial swi_swi_mip · axial · 24.0mm · 1.02mm/px · z∈[-111,+32]mm · 2 of 49 slices shown]
[im 1/49]
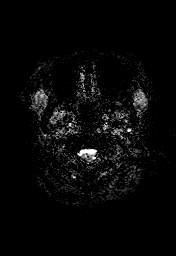
[im 49/49]
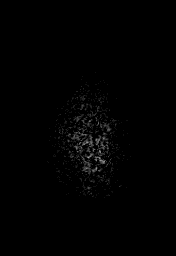

[axial swi_mpr_axi · axial · 5.0mm · 1.02mm/px · 1 of 25 slices shown]
[im 1/25]
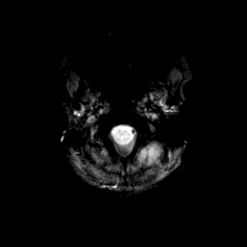

[T2 · coronal · 4.0mm · 0.31mm/px · 1 of 25 slices shown]
[im 1/25]
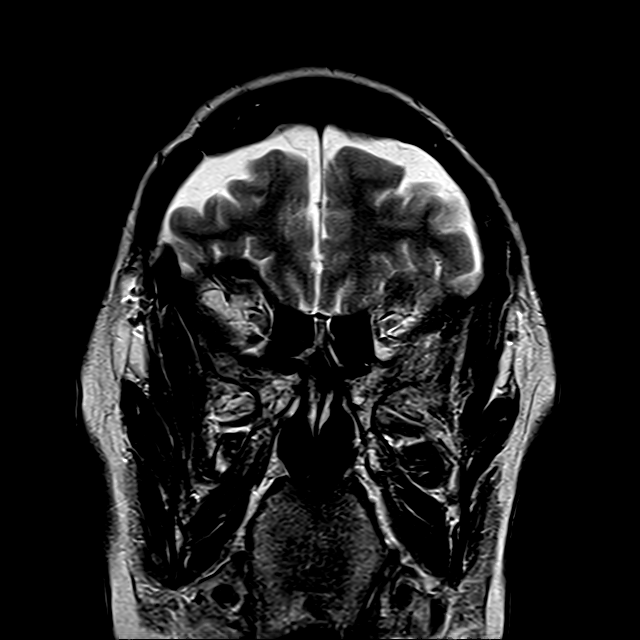

[angio_cranio_venosa_pre · sagittal · 1.3mm · 0.84mm/px · 5 of 128 slices shown]
[im 1/128]
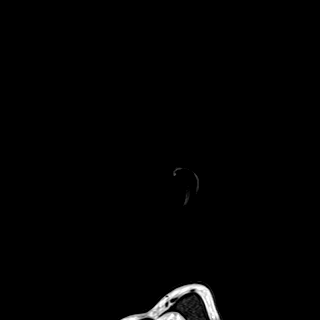
[im 32/128]
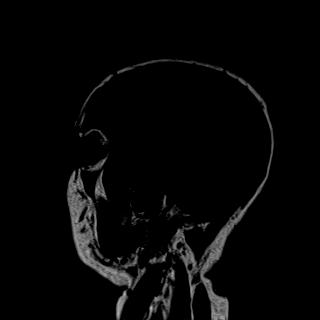
[im 64/128]
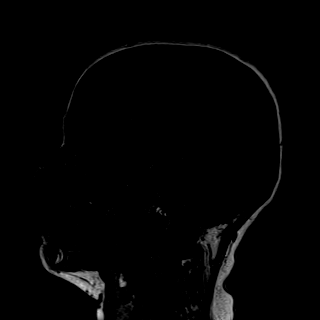
[im 96/128]
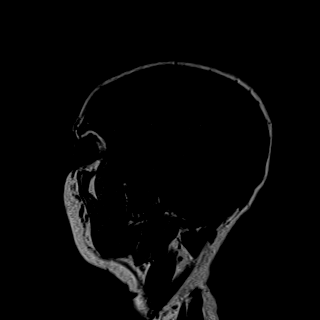
[im 128/128]
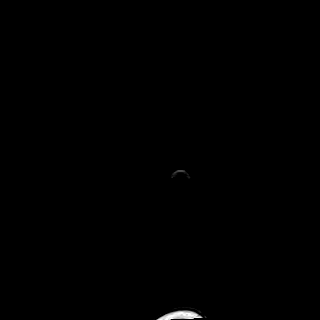

[angio_cranio_venosa_pos · sagittal · 1.3mm · 0.84mm/px · 5 of 128 slices shown]
[im 1/128]
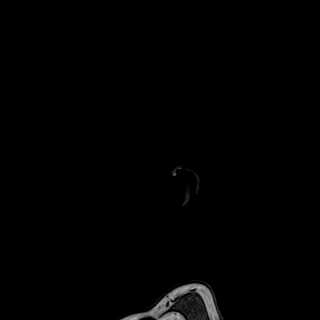
[im 32/128]
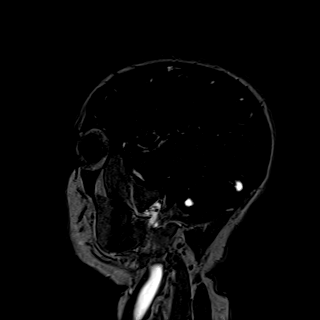
[im 64/128]
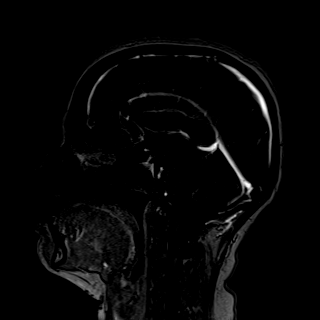
[im 96/128]
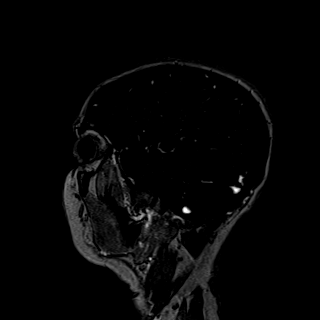
[im 128/128]
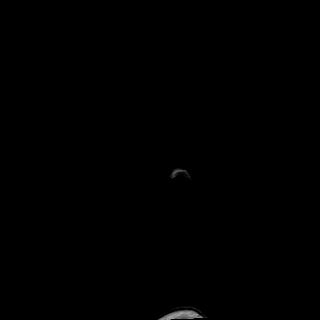

[angio_cranio_venosa_pos_sub · sagittal · 1.3mm · 0.84mm/px · 5 of 121 slices shown]
[im 1/121]
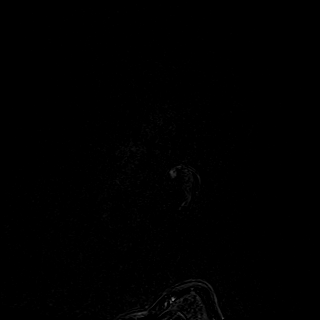
[im 31/121]
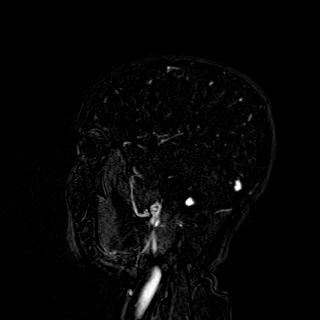
[im 61/121]
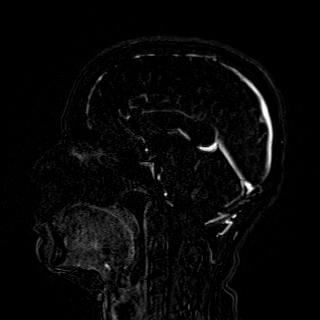
[im 91/121]
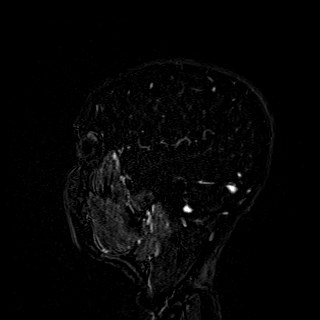
[im 121/121]
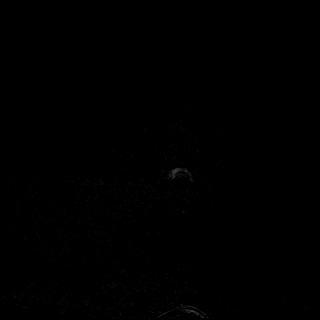

[sag_t1_1mm_contraste · sagittal · 1.0mm · 0.80mm/px · 1 of 176 slices shown]
[im 1/176]
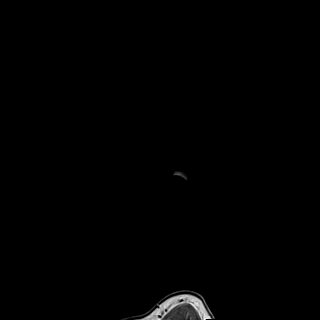

[38 of 48 positions shown; findings below may reference images not displayed]

ANGIORRESSONÂNCIA MAGNÉTICA ARTERIAL INTRACRANIANA
TÉCNICA:
Exame realizado em equipamento de ressonância magnética, com sequências multiplanares e ponderações específicas para o segmento de interesse, com administração de contraste paramagnético.
RESULTADO:
Craniotomia pterional direita.
Clipe cirúrgico parasselar à direita, gerando artefatos de susceptibilidade magnética que degradam algumas imagens. Não se evidenciam imagens sugestivas de aneurismas adjacentes.
Artérias carótidas internas com trajeto, calibre e sinal de fluxo normais.
Hipoplasia da artéria vertebral direita.
Artérias vertebral esquerda e basilar com calibre trajeto e sinal de fluxo conservados.
Artérias cerebrais anteriores, médias e posteriores com trajeto, calibre e sinal de fluxo preservados.
Não se observam dilatações vasculares ou estenoses segmentares.

CONCLUSÃO:
Craniotomia pterional direita.
Clipe cirúrgico parasselar à direita, gerando artefatos de susceptibilidade magnética que degradam algumas imagens. Não se evidenciam imagens sugestivas de aneurismas adjacentes.

## 2023-08-31 IMAGING — MR Angios Cranio VENOSA
14 of 16 series · 36 of 48 positions shown · non-contrast
Comparison: none

[Series 8001: sagital_t1_(person_name)_(person_name) · sagittal · 5.0mm · 0.43mm/px · 2 of 25 slices shown]
[im 1/25]
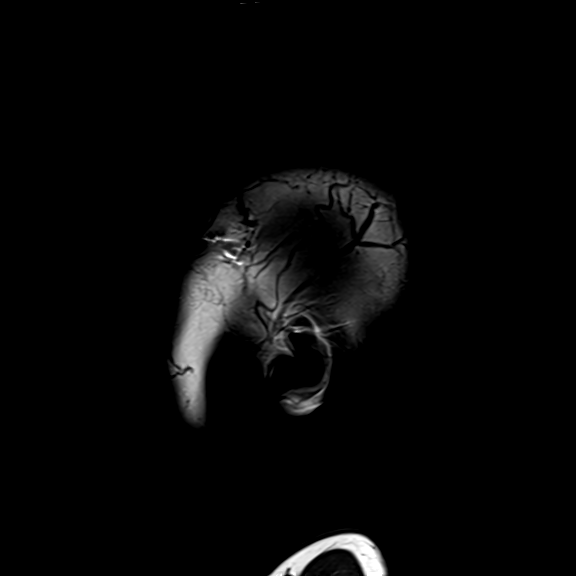
[im 25/25]
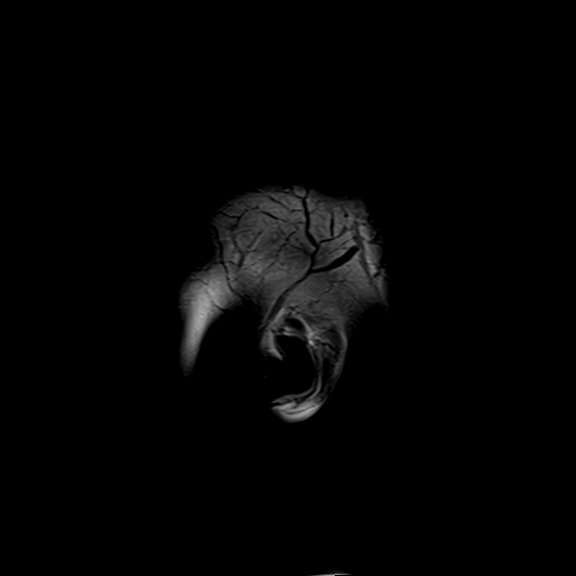

[Series 9001: angio_tof_(person_name)3(person_name)_arterial · axial · 0.5mm · 0.43mm/px · z∈[-128,-30]mm · 8 of 208 slices shown]
[im 1/208]
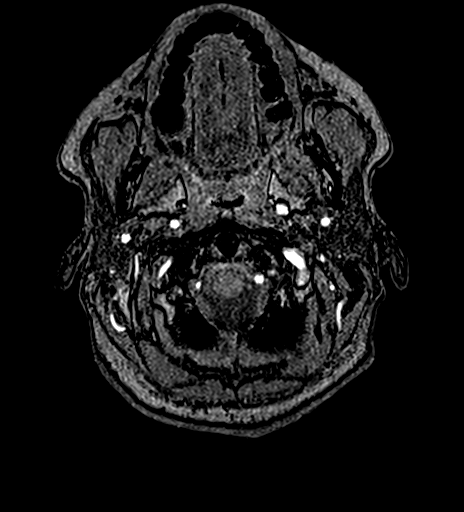
[im 24/208]
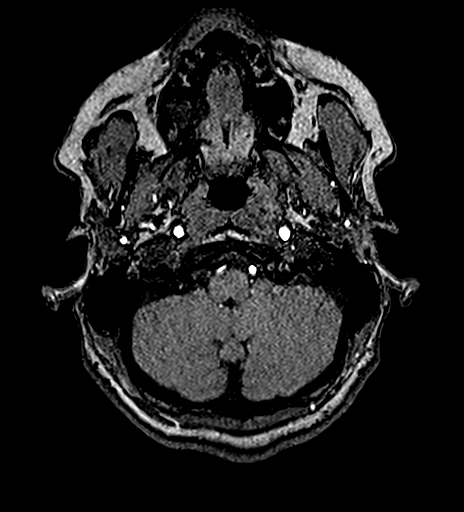
[im 70/208]
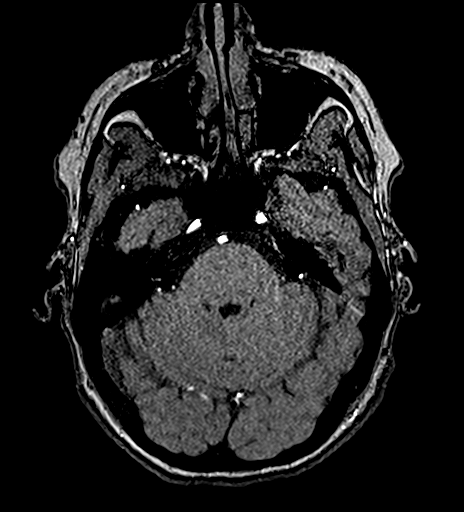
[im 93/208]
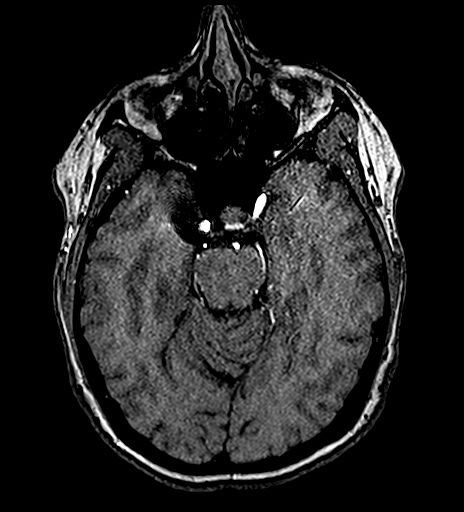
[im 116/208]
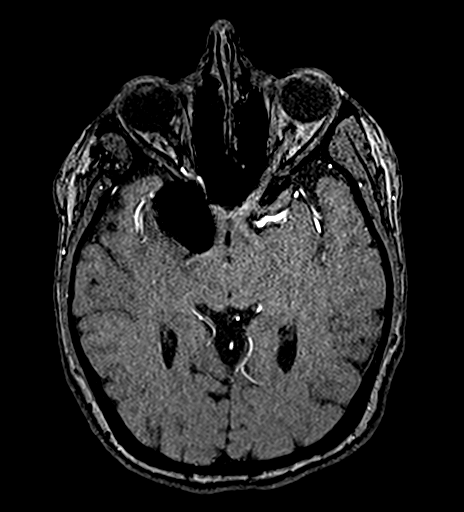
[im 139/208]
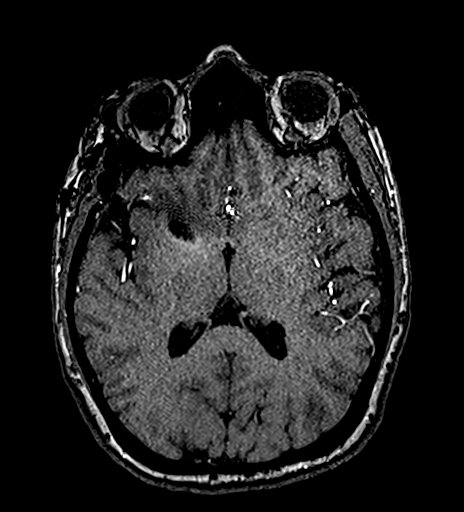
[im 185/208]
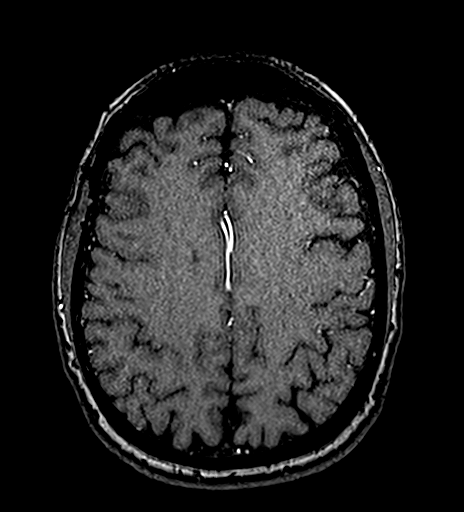
[im 208/208]
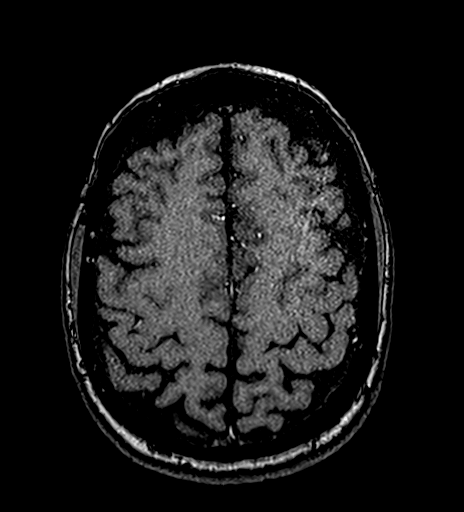

[axial_flair_(person_name) · axial · 5.5mm · 0.42mm/px · 1 of 25 slices shown]
[im 1/25]
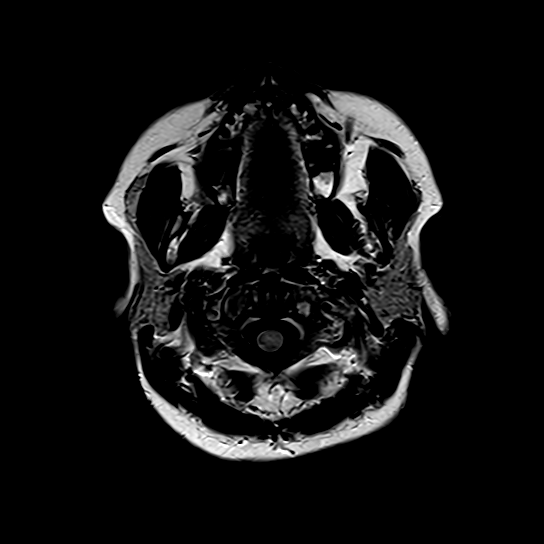

[resolve_(id)_trace_tra_p2_160_tracew · axial · 5.0mm · 1.44mm/px · 1 of 25 slices shown (1 of 2)]
[im 1/25]
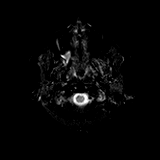

[resolve_(id)_trace_tra_p2_160_tracew · axial · 5.0mm · 1.44mm/px · 1 of 25 slices shown (2 of 2)]
[im 1/25]
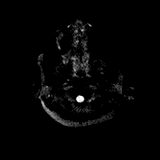

[resolve_(id)_trace_tra_p2_160_adc · axial · 5.0mm · 1.44mm/px · 1 of 25 slices shown]
[im 1/25]
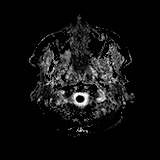

[axial swi_mag · axial · 3.0mm · 1.02mm/px · z∈[-121,+42]mm · 2 of 56 slices shown]
[im 1/56]
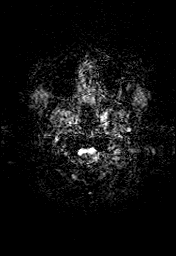
[im 56/56]
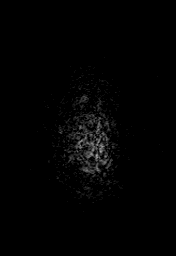

[axial swi_pha · axial · 3.0mm · 1.02mm/px · z∈[-121,+39]mm · 2 of 55 slices shown]
[im 1/55]
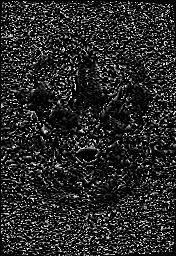
[im 55/55]
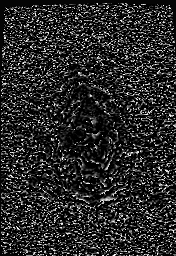

[axial swi_swi · axial · 3.0mm · 1.02mm/px · z∈[-121,+42]mm · 2 of 56 slices shown]
[im 1/56]
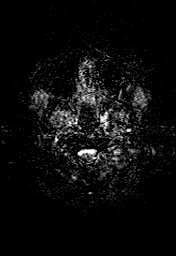
[im 56/56]
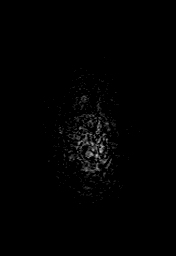

[axial swi_swi_mip · axial · 24.0mm · 1.02mm/px · z∈[-111,+32]mm · 2 of 49 slices shown]
[im 1/49]
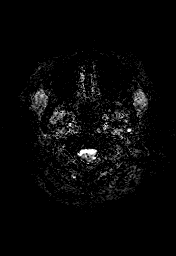
[im 49/49]
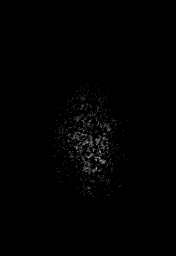

[T2 · coronal · 4.0mm · 0.31mm/px · 1 of 25 slices shown]
[im 1/25]
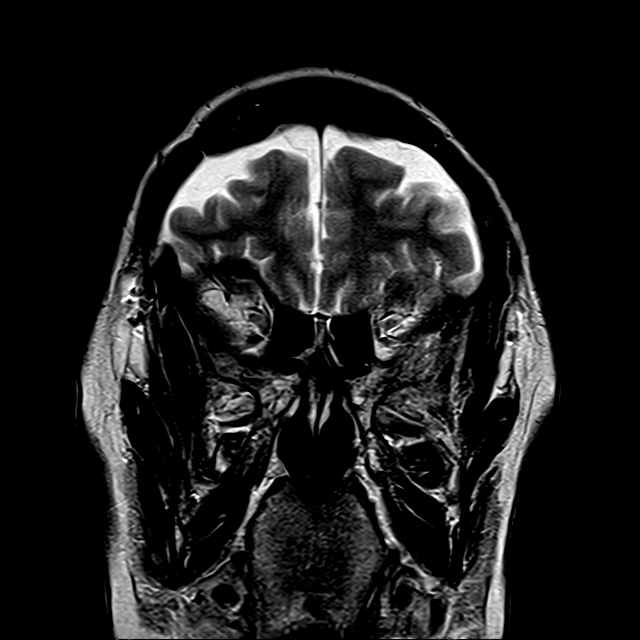

[angio_cranio_venosa_pre · sagittal · 1.3mm · 0.84mm/px · 5 of 128 slices shown]
[im 1/128]
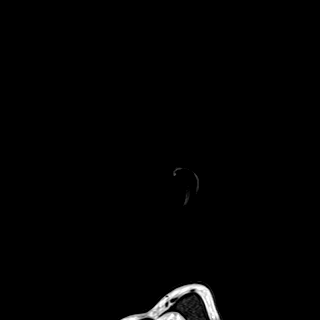
[im 32/128]
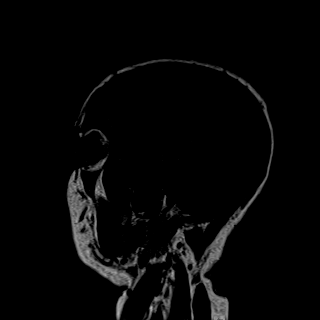
[im 64/128]
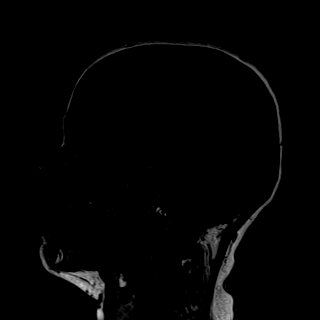
[im 96/128]
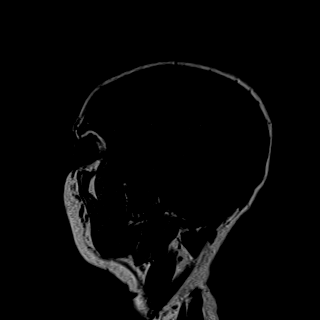
[im 128/128]
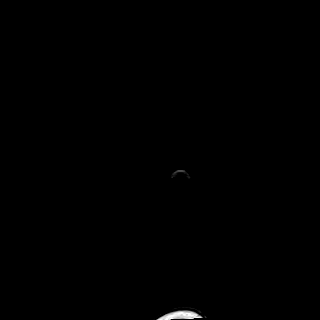

[angio_cranio_venosa_pos · sagittal · 1.3mm · 0.84mm/px · 5 of 128 slices shown]
[im 1/128]
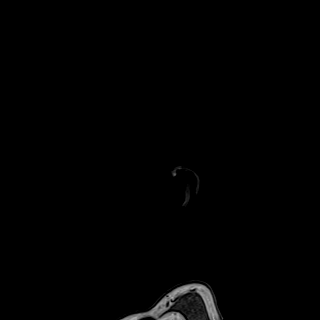
[im 32/128]
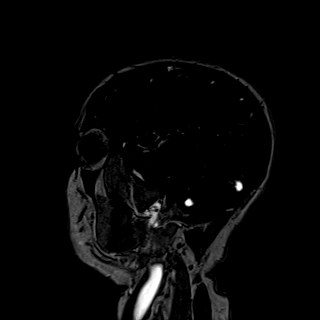
[im 64/128]
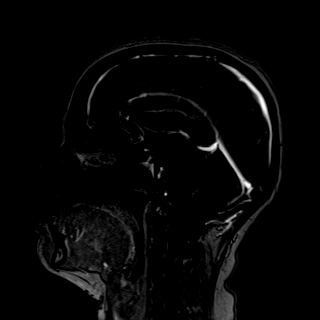
[im 96/128]
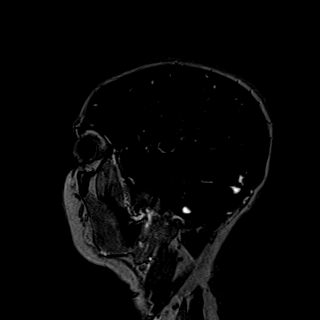
[im 128/128]
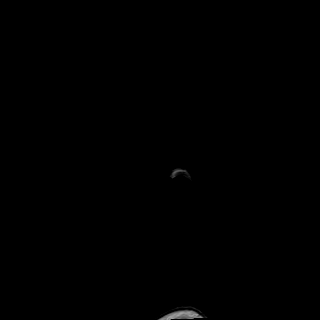

[angio_cranio_venosa_pos_sub · sagittal · 1.3mm · 0.84mm/px · 3 of 121 slices shown]
[im 1/121]
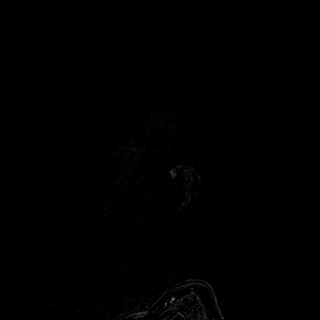
[im 31/121]
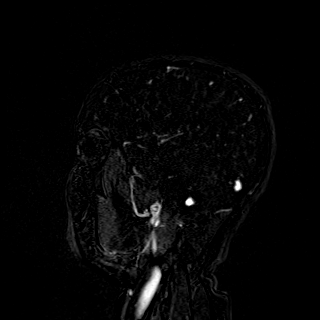
[im 61/121]
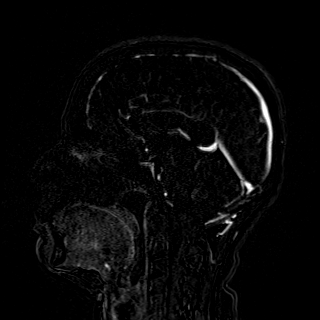

[36 of 48 positions shown; findings below may reference images not displayed]

ANGIORESSONÂNCIA MAGNÉTICA VENOSA INTRACRANIANA COM CONTRASTE

TÉCNICA:
Exame realizado em equipamento de ressonância magnética com sequências ponderações e planos específicos para o segmento de interesse, antes e após o uso endovenoso de contraste.

RESULTADO:
Os seios venosos intracranianos foram bem visualizados e apresentam aspecto usual, sem falhas de enchimento nos seus interiores que pudessem representar trombose venosa recente.
Veias corticais anatômicas.
Não há evidências de malformações arteriovenosas intracranianas.

CONCLUSÃO:
Angiorressonância magnética venosa intracraniana sem alterações significativas.
